# Patient Record
Sex: Female | Born: 1960 | Race: White | Hispanic: No | Marital: Married
Health system: Southern US, Community
[De-identification: ages and names within clinical notes are randomized; demographics above are authoritative.]

## PROBLEM LIST (undated history)

## (undated) HISTORY — PX: BREAST BIOPSY: SHX20

---

## 2005-01-05 ENCOUNTER — Ambulatory Visit: Payer: Self-pay | Admitting: Internal Medicine

## 2006-02-21 ENCOUNTER — Ambulatory Visit: Payer: Self-pay | Admitting: Internal Medicine

## 2007-04-26 ENCOUNTER — Ambulatory Visit: Payer: Self-pay | Admitting: Internal Medicine

## 2008-05-27 ENCOUNTER — Ambulatory Visit: Payer: Self-pay | Admitting: Internal Medicine

## 2015-10-26 ENCOUNTER — Other Ambulatory Visit: Payer: Self-pay | Admitting: Internal Medicine

## 2015-10-26 DIAGNOSIS — Z1231 Encounter for screening mammogram for malignant neoplasm of breast: Secondary | ICD-10-CM

## 2015-11-06 ENCOUNTER — Ambulatory Visit
Admission: RE | Admit: 2015-11-06 | Discharge: 2015-11-06 | Disposition: A | Discharge status: Home / Self Care | Payer: 59 | Source: Ambulatory Visit | Attending: Internal Medicine | Admitting: Internal Medicine

## 2015-11-06 DIAGNOSIS — Z1231 Encounter for screening mammogram for malignant neoplasm of breast: Secondary | ICD-10-CM | POA: Diagnosis present

## 2017-01-09 DIAGNOSIS — R197 Diarrhea, unspecified: Secondary | ICD-10-CM | POA: Diagnosis not present

## 2017-01-13 DIAGNOSIS — R197 Diarrhea, unspecified: Secondary | ICD-10-CM | POA: Diagnosis not present

## 2017-07-04 DIAGNOSIS — Z0000 Encounter for general adult medical examination without abnormal findings: Secondary | ICD-10-CM | POA: Diagnosis not present

## 2017-07-04 DIAGNOSIS — Z1231 Encounter for screening mammogram for malignant neoplasm of breast: Secondary | ICD-10-CM | POA: Diagnosis not present

## 2017-07-04 DIAGNOSIS — Z23 Encounter for immunization: Secondary | ICD-10-CM | POA: Diagnosis not present

## 2017-09-27 DIAGNOSIS — L400 Psoriasis vulgaris: Secondary | ICD-10-CM | POA: Diagnosis not present

## 2018-04-27 ENCOUNTER — Other Ambulatory Visit: Payer: Self-pay | Admitting: Internal Medicine

## 2018-04-27 DIAGNOSIS — Z1231 Encounter for screening mammogram for malignant neoplasm of breast: Secondary | ICD-10-CM

## 2018-05-17 ENCOUNTER — Ambulatory Visit
Admission: RE | Admit: 2018-05-17 | Discharge: 2018-05-17 | Disposition: A | Discharge status: Home / Self Care | Payer: 59 | Source: Ambulatory Visit | Attending: Internal Medicine | Admitting: Internal Medicine

## 2018-05-17 DIAGNOSIS — Z1231 Encounter for screening mammogram for malignant neoplasm of breast: Secondary | ICD-10-CM | POA: Diagnosis not present

## 2018-06-30 DIAGNOSIS — Z23 Encounter for immunization: Secondary | ICD-10-CM | POA: Diagnosis not present

## 2018-07-05 DIAGNOSIS — Z0000 Encounter for general adult medical examination without abnormal findings: Secondary | ICD-10-CM | POA: Diagnosis not present

## 2018-07-05 DIAGNOSIS — Z6841 Body Mass Index (BMI) 40.0 and over, adult: Secondary | ICD-10-CM | POA: Diagnosis not present

## 2018-09-28 DIAGNOSIS — Z1283 Encounter for screening for malignant neoplasm of skin: Secondary | ICD-10-CM | POA: Diagnosis not present

## 2018-09-28 DIAGNOSIS — L304 Erythema intertrigo: Secondary | ICD-10-CM | POA: Diagnosis not present

## 2018-09-28 DIAGNOSIS — L400 Psoriasis vulgaris: Secondary | ICD-10-CM | POA: Diagnosis not present

## 2018-10-02 IMAGING — MG MM DIGITAL SCREENING BILAT W/ TOMO W/ CAD
6 of 10 series · 6 of 30 positions shown · non-contrast
Comparison: Previous exam(s).

CLINICAL DATA: Screening.

EXAM:
DIGITAL SCREENING BILATERAL MAMMOGRAM WITH TOMO AND CAD

[L CC synth-2D]
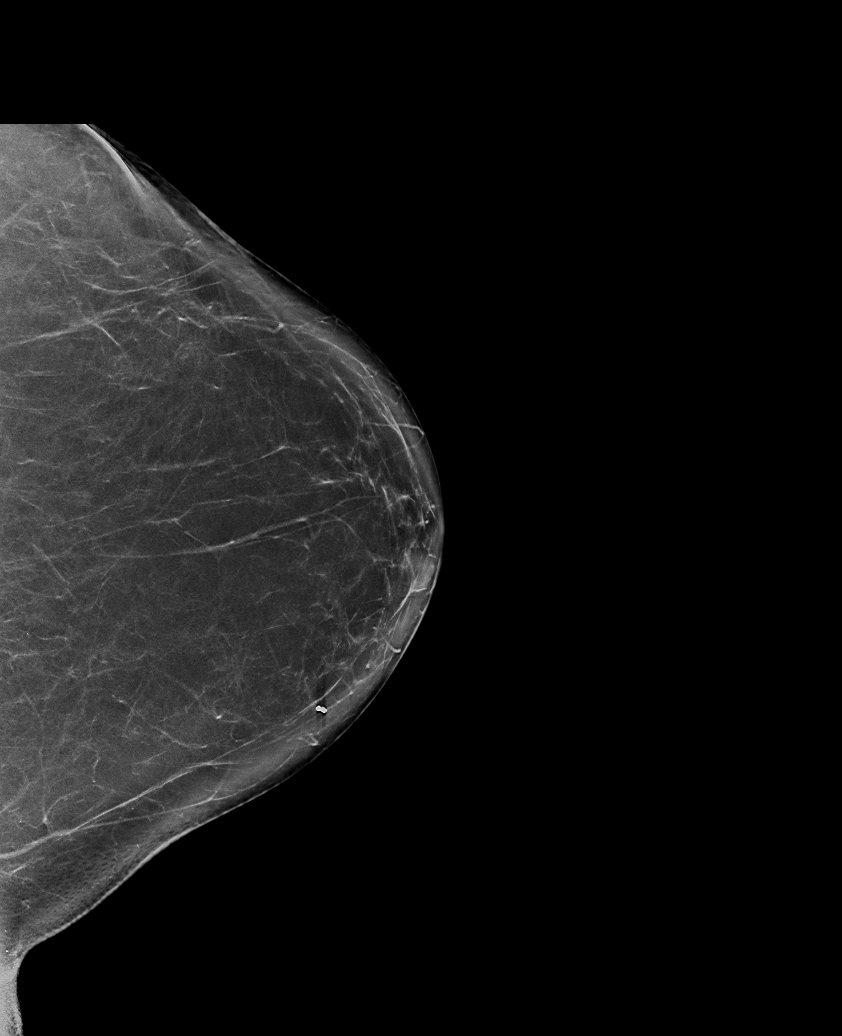

[L MLO synth-2D (1 of 2)]
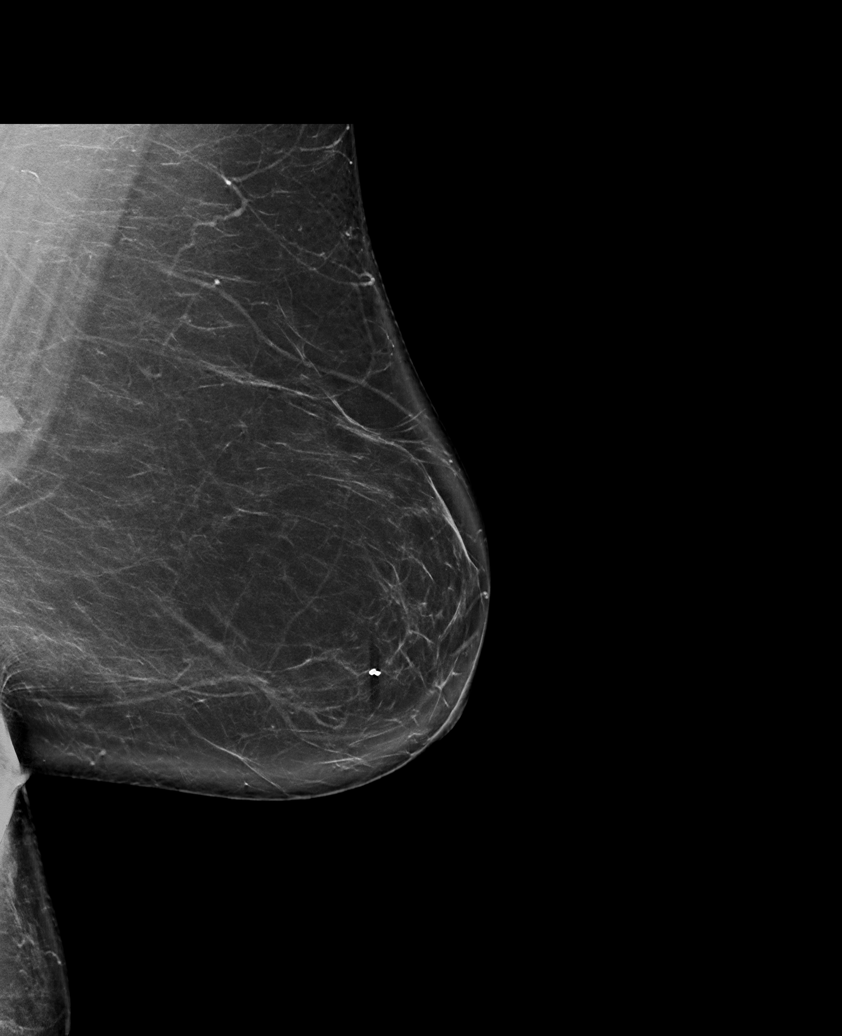

[R CC synth-2D]
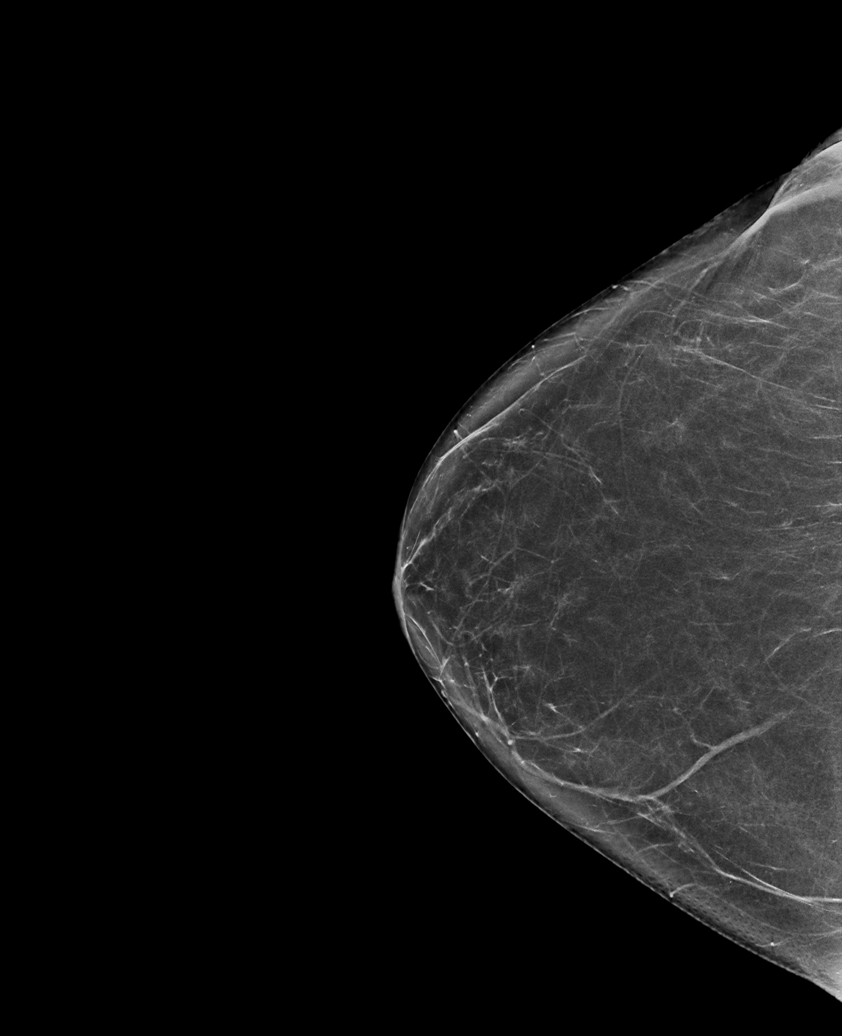

[R MLO synth-2D]
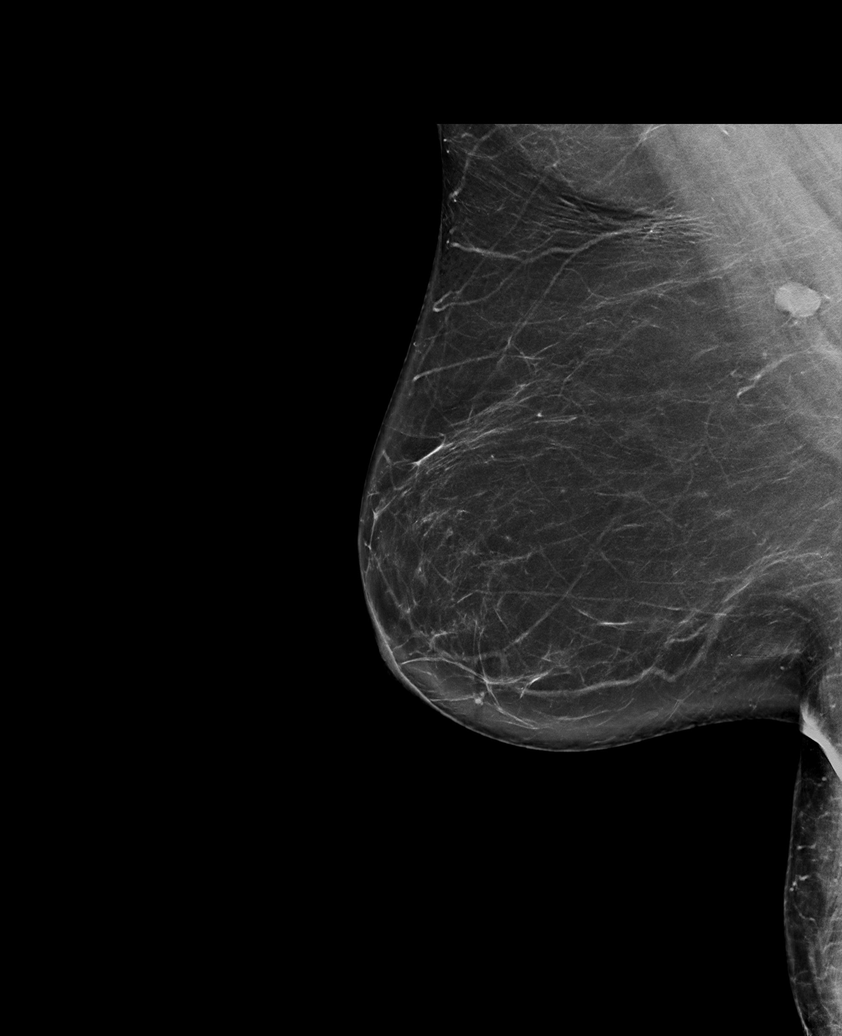

[L MLO synth-2D (2 of 2)]
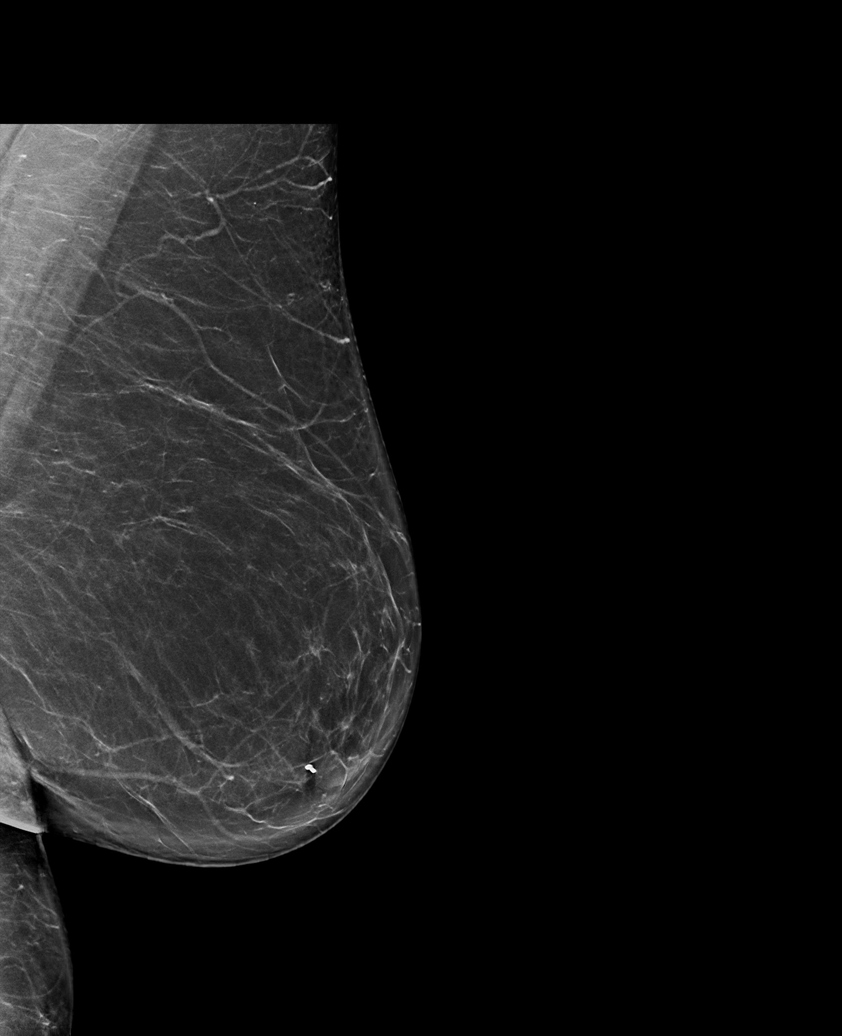

[L CC tomo · tomo slice 41/82.0]
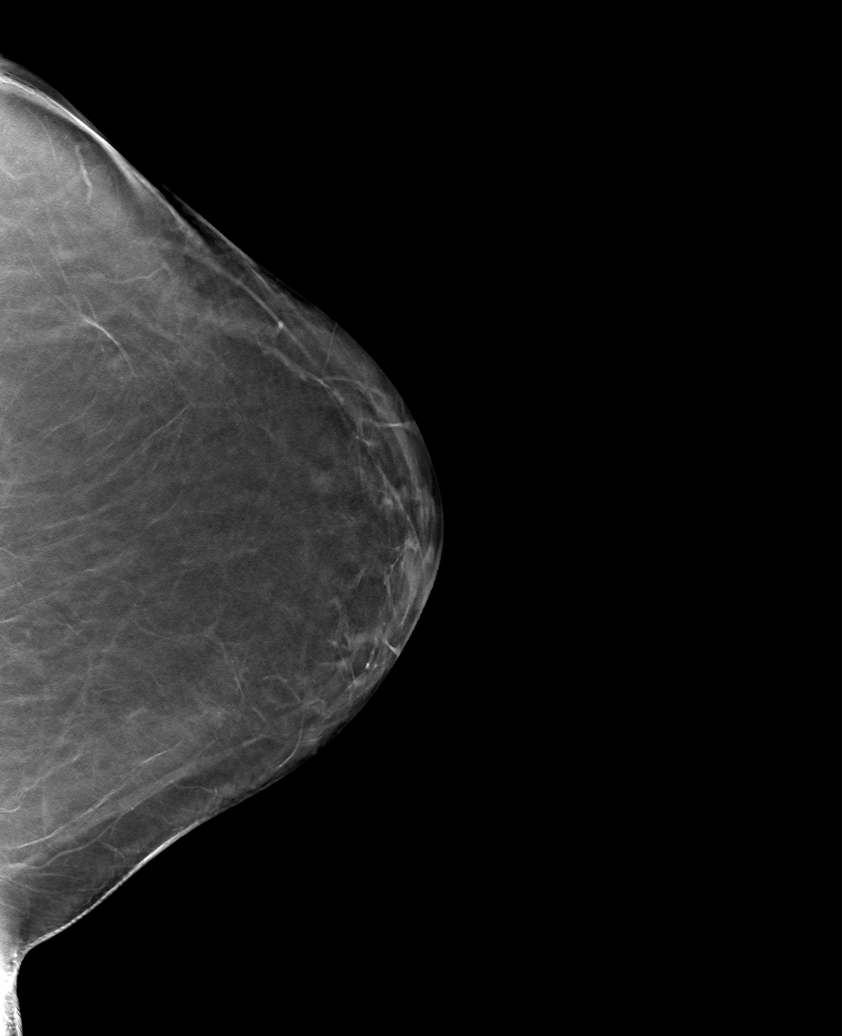

[6 of 30 positions shown; findings below may reference images not displayed]

ACR Breast Density Category b: There are scattered areas of
fibroglandular density.
FINDINGS: There are no findings suspicious for malignancy. Images were
processed with CAD.
IMPRESSION: No mammographic evidence of malignancy. A result letter of this
screening mammogram will be mailed directly to the patient.

RECOMMENDATION:
Screening mammogram in one year. (Code:[id number])

BI-RADS CATEGORY  1: Negative.

## 2019-07-10 ENCOUNTER — Other Ambulatory Visit: Payer: Self-pay | Admitting: Internal Medicine

## 2019-07-10 DIAGNOSIS — Z1231 Encounter for screening mammogram for malignant neoplasm of breast: Secondary | ICD-10-CM

## 2019-07-22 ENCOUNTER — Ambulatory Visit
Admission: RE | Admit: 2019-07-22 | Discharge: 2019-07-22 | Disposition: A | Discharge status: Home / Self Care | Payer: 59 | Source: Ambulatory Visit | Attending: Internal Medicine | Admitting: Internal Medicine

## 2019-07-22 ENCOUNTER — Other Ambulatory Visit: Payer: Self-pay

## 2019-07-22 ENCOUNTER — Encounter (INDEPENDENT_AMBULATORY_CARE_PROVIDER_SITE_OTHER): Payer: Self-pay

## 2019-07-22 DIAGNOSIS — Z1231 Encounter for screening mammogram for malignant neoplasm of breast: Secondary | ICD-10-CM | POA: Insufficient documentation

## 2020-07-06 ENCOUNTER — Other Ambulatory Visit: Payer: Self-pay | Admitting: Internal Medicine

## 2020-07-06 DIAGNOSIS — Z1231 Encounter for screening mammogram for malignant neoplasm of breast: Secondary | ICD-10-CM

## 2020-07-28 ENCOUNTER — Other Ambulatory Visit: Payer: Self-pay

## 2020-07-28 ENCOUNTER — Ambulatory Visit
Admission: RE | Admit: 2020-07-28 | Discharge: 2020-07-28 | Disposition: A | Discharge status: Home / Self Care | Payer: 59 | Source: Ambulatory Visit | Attending: Internal Medicine | Admitting: Internal Medicine

## 2020-07-28 DIAGNOSIS — Z1231 Encounter for screening mammogram for malignant neoplasm of breast: Secondary | ICD-10-CM | POA: Diagnosis not present

## 2020-08-03 ENCOUNTER — Other Ambulatory Visit: Payer: Self-pay | Admitting: Internal Medicine

## 2020-08-03 DIAGNOSIS — N6489 Other specified disorders of breast: Secondary | ICD-10-CM

## 2020-08-03 DIAGNOSIS — R928 Other abnormal and inconclusive findings on diagnostic imaging of breast: Secondary | ICD-10-CM

## 2020-08-12 ENCOUNTER — Other Ambulatory Visit: Payer: Self-pay

## 2020-08-12 ENCOUNTER — Ambulatory Visit
Admission: RE | Admit: 2020-08-12 | Discharge: 2020-08-12 | Disposition: A | Discharge status: Home / Self Care | Payer: 59 | Source: Ambulatory Visit | Attending: Internal Medicine | Admitting: Internal Medicine

## 2020-08-12 DIAGNOSIS — R928 Other abnormal and inconclusive findings on diagnostic imaging of breast: Secondary | ICD-10-CM

## 2020-08-12 DIAGNOSIS — N6489 Other specified disorders of breast: Secondary | ICD-10-CM

## 2021-07-12 ENCOUNTER — Other Ambulatory Visit: Payer: Self-pay | Admitting: Internal Medicine

## 2021-07-12 DIAGNOSIS — Z1231 Encounter for screening mammogram for malignant neoplasm of breast: Secondary | ICD-10-CM

## 2021-07-29 ENCOUNTER — Ambulatory Visit
Admission: RE | Admit: 2021-07-29 | Discharge: 2021-07-29 | Disposition: A | Discharge status: Home / Self Care | Payer: 59 | Source: Ambulatory Visit | Attending: Internal Medicine | Admitting: Internal Medicine

## 2021-07-29 ENCOUNTER — Other Ambulatory Visit: Payer: Self-pay

## 2021-07-29 DIAGNOSIS — Z1231 Encounter for screening mammogram for malignant neoplasm of breast: Secondary | ICD-10-CM | POA: Diagnosis not present

## 2021-11-10 ENCOUNTER — Other Ambulatory Visit: Payer: Self-pay

## 2021-11-10 ENCOUNTER — Emergency Department: Payer: 59

## 2021-11-10 ENCOUNTER — Emergency Department
Admission: EM | Admit: 2021-11-10 | Discharge: 2021-11-10 | Disposition: A | Discharge status: Home / Self Care | Payer: 59 | Attending: Emergency Medicine | Admitting: Emergency Medicine

## 2021-11-10 ENCOUNTER — Encounter: Payer: Self-pay | Admitting: Emergency Medicine

## 2021-11-10 DIAGNOSIS — I10 Essential (primary) hypertension: Secondary | ICD-10-CM | POA: Diagnosis present

## 2021-11-10 LAB — BASIC METABOLIC PANEL
Anion gap: 9 (ref 5–15)
BUN: 18 mg/dL (ref 8–23)
CO2: 25 mmol/L (ref 22–32)
Calcium: 9 mg/dL (ref 8.9–10.3)
Chloride: 104 mmol/L (ref 98–111)
Creatinine, Ser: 0.88 mg/dL (ref 0.44–1.00)
GFR, Estimated: >60 mL/min (ref >60)
Glucose, Bld: 95 mg/dL (ref 70–99)
Potassium: 3.9 mmol/L (ref 3.5–5.1)
Sodium: 138 mmol/L (ref 135–145)

## 2021-11-10 LAB — CBC
HCT: 38.6 % (ref 36.0–46.0)
Hemoglobin: 11.9 g/dL — ABNORMAL LOW (ref 12.0–15.0)
MCH: 27.8 pg (ref 26.0–34.0)
MCHC: 30.8 g/dL (ref 30.0–36.0)
MCV: 90.2 fL (ref 80.0–100.0)
Platelets: 367 10*3/uL (ref 150–400)
RBC: 4.28 MIL/uL (ref 3.87–5.11)
RDW: 13.5 % (ref 11.5–15.5)
WBC: 8.1 10*3/uL (ref 4.0–10.5)
nRBC: 0 % (ref 0.0–0.2)

## 2021-11-10 LAB — TROPONIN I (HIGH SENSITIVITY)
Troponin I (High Sensitivity): 4 ng/L (ref <18)
Troponin I (High Sensitivity): 5 ng/L (ref <18)

## 2021-11-10 MED ORDER — AMLODIPINE BESYLATE 5 MG PO TABS: 5.0000 mg | ORAL_TABLET | Freq: Every day | ORAL | 0 refills | Status: AC

## 2021-11-10 NOTE — ED Notes (Signed)
4 yof c/o having an uncomfortable feeling in her chest and some nausea today. Recent hx of covid two weeks ago. Pt rated pain 3.

## 2021-11-10 NOTE — Discharge Instructions (Signed)
-  Follow-up with your primary care provider as discussed -Take your medication as prescribed -Return to the emergency department at any time if you begin to experience any new or worsening symptoms.

## 2021-11-10 NOTE — ED Triage Notes (Signed)
First Nurse Note:  Arrives from [facility] for evaluation of elevated BP.  AAOx3. Skin warm and dry .NAD

## 2021-11-10 NOTE — ED Provider Notes (Signed)
[facility] Provider Note    None    (approximate)   History   Chief Complaint Hypertension   HPI Jasmine Wiggins is a 61 y.o. female, no remarkable medical history, presents to the emergency department for evaluation of hypertension.  Patient states that she presented to [facility] clinic earlier this morning for evaluation of funny feeling in her chest.  When she presented, found that her blood pressure was [id number] systolic.  They sent her over here for further evaluation.  Patient states that she currently feels well.  Denies fever/chills, shortness of breath, abdominal pain, back pain, headache, nausea/vomiting, or urinary symptoms  History Limitations: No limitations      Physical Exam  Triage Vital Signs: ED Triage Vitals [11/10/21 1520]  Enc Vitals Group     BP (!) 160/99     Pulse Rate 65     Resp 17     Temp 98.3 F (36.8 C)     Temp Source Oral     SpO2 100 %     Weight 284 lb (128.8 kg)     Height 5\' 4"  (1.626 m)     Head Circumference      Peak Flow      Pain Score 2     Pain Loc      Pain Edu?      Excl. in [place]?     Most recent vital signs: Vitals:   11/10/21 1520  BP: (!) 160/99  Pulse: 65  Resp: 17  Temp: 98.3 F (36.8 C)  SpO2: 100%    General: Awake, NAD.  CV: Good peripheral perfusion.  S1-S2 present no murmurs rubs or gallops. Resp: Normal effort.  Lung sounds clear bilaterally in the apices and bases Abd: Soft, non-tender. No distention.  Neuro: At baseline. No gross neurological deficits. Other: No chest wall tenderness  Physical Exam    ED Results / Procedures / Treatments  Labs (all labs ordered are listed, but only abnormal results are displayed) Labs Reviewed  CBC - Abnormal; Notable for the following components:      Result Value   Hemoglobin 11.9 (*)    All other components within normal limits  BASIC METABOLIC PANEL  TROPONIN I (HIGH SENSITIVITY)  TROPONIN I (HIGH SENSITIVITY)      EKG Sinus bradycardia, rate 57, no ST segment changes, no axis deviations, no AV blocks.   RADIOLOGY  ED Provider Interpretation: I personally reviewed and interpreted this image.  No evidence of active cardiopulmonary disease  DG Chest 2 View  Result Date: 11/10/2021 CLINICAL DATA:  Chest pain EXAM: CHEST - 2 VIEW COMPARISON:  None. FINDINGS: Cardiac silhouette and mediastinal contours are within normal limits. Mild bilateral lower lung interstitial thickening on frontal view appears chronic, possibly scarring. No focal airspace opacity. No pulmonary edema, pleural effusion, or pneumothorax. Moderate multilevel degenerative disc changes of the thoracic spine. IMPRESSION: Mild bilateral lower lung linear opacities, favored to represent chronic scarring. No definite acute lung process. Electronically Signed   By: Yvonne Kendall M.D.   On: 11/10/2021 15:45    PROCEDURES:  Critical Care performed: None.  Procedures    MEDICATIONS ORDERED IN ED: Medications - No data to display   IMPRESSION / MDM / [place] / ED COURSE  I reviewed the triage vital signs and the nursing notes.  Jasmine Wiggins is a 61 y.o. female, no remarkable medical history, presents to the emergency department for evaluation of hypertension.  Patient states that she presented to [facility] clinic earlier this morning for evaluation of funny feeling in her chest.  When she presented, found that her blood pressure was [id number] systolic.  They sent her over here for further evaluation.  Patient states that she currently feels well.  Differential diagnosis includes, but is not limited to, ACS, PE, dissection, hypertensive crisis, GERD  ED Course Patient appears well.  She is notably hypertensive at 160/99.  She is afebrile  CBC unremarkable for leukocytosis or clinically significant anemia.  BMP unremarkable for electrolyte abnormalities.  Initial troponin 4.  Second troponin  5.  Unremarkable EKG.  Unlikely ACS or myocarditis/pericarditis  Chest x-ray negative for evidence of cardiomegaly or pneumonia.  Assessment/Plan Given the patient's history, physical exam, and work-up thus far, I do not suspect a serious or life-threatening pathology.  EKG, labs, and imaging reassuring.  Very low suspicion for dissection or pulmonary embolism given her presentation.  Patient is notably hypertensive, but I do not believe it is in need of any emergent treatment at this time.  Patient states that she has a established primary care provider that she can speak with [place] about, though is unsure how long it will take to get an appointment.  We will go ahead and provide patient a short-term prescription for amlodipine for hypertension management.  We will plan to discharge this patient.   Patient was provided with anticipatory guidance, return precautions, and educational material. Encouraged the patient to return to the emergency department at any time if they begin to experience any new or worsening symptoms.      FINAL CLINICAL IMPRESSION(S) / ED DIAGNOSES   Final diagnoses:  Hypertension, unspecified type     Rx / DC Orders   ED Discharge Orders          Ordered    amLODipine (NORVASC) 5 MG tablet  Daily        11/10/21 2017             Note:  This document was prepared using Dragon voice recognition software and may include unintentional dictation errors.   Teodoro Spray, [place] 11/10/21 2020    Rada Hay, MD 11/10/21 2036

## 2021-11-10 NOTE — ED Triage Notes (Signed)
Pt comes into the ED via POV c/o HTN today.  Pt has no h/o HTN, but her BP had systolic over 190.  Pt does admit that her chest has "felt funny", but denies that it is an actual pain or pressure.  Pt currently ambulatory to triage and in NAD with even and unlabored respirations.

## 2022-07-13 ENCOUNTER — Other Ambulatory Visit: Payer: Self-pay | Admitting: Internal Medicine

## 2022-07-13 DIAGNOSIS — Z1231 Encounter for screening mammogram for malignant neoplasm of breast: Secondary | ICD-10-CM

## 2022-08-12 ENCOUNTER — Ambulatory Visit
Admission: RE | Admit: 2022-08-12 | Discharge: 2022-08-12 | Disposition: A | Discharge status: Home / Self Care | Payer: 59 | Source: Ambulatory Visit | Attending: Internal Medicine | Admitting: Internal Medicine

## 2022-08-12 DIAGNOSIS — Z1231 Encounter for screening mammogram for malignant neoplasm of breast: Secondary | ICD-10-CM | POA: Insufficient documentation

## 2023-07-12 ENCOUNTER — Other Ambulatory Visit: Payer: Self-pay | Admitting: Internal Medicine

## 2023-07-12 DIAGNOSIS — Z1231 Encounter for screening mammogram for malignant neoplasm of breast: Secondary | ICD-10-CM

## 2023-07-21 ENCOUNTER — Other Ambulatory Visit: Payer: Self-pay | Admitting: Internal Medicine

## 2023-07-21 DIAGNOSIS — Z0000 Encounter for general adult medical examination without abnormal findings: Secondary | ICD-10-CM

## 2023-07-26 ENCOUNTER — Ambulatory Visit
Admission: RE | Admit: 2023-07-26 | Discharge: 2023-07-26 | Disposition: A | Discharge status: Home / Self Care | Payer: 59 | Source: Ambulatory Visit | Attending: Internal Medicine | Admitting: Internal Medicine

## 2023-07-26 DIAGNOSIS — Z0000 Encounter for general adult medical examination without abnormal findings: Secondary | ICD-10-CM | POA: Insufficient documentation

## 2023-08-14 ENCOUNTER — Ambulatory Visit
Admission: RE | Admit: 2023-08-14 | Discharge: 2023-08-14 | Disposition: A | Discharge status: Home / Self Care | Payer: 59 | Source: Ambulatory Visit | Attending: Internal Medicine | Admitting: Internal Medicine

## 2023-08-14 DIAGNOSIS — Z1231 Encounter for screening mammogram for malignant neoplasm of breast: Secondary | ICD-10-CM | POA: Diagnosis present

## 2024-07-16 ENCOUNTER — Other Ambulatory Visit: Payer: Self-pay | Admitting: Internal Medicine

## 2024-07-16 DIAGNOSIS — Z1231 Encounter for screening mammogram for malignant neoplasm of breast: Secondary | ICD-10-CM

## 2024-08-03 LAB — COLOGUARD: COLOGUARD: NEGATIVE

## 2024-08-14 ENCOUNTER — Ambulatory Visit
Admission: RE | Admit: 2024-08-14 | Discharge: 2024-08-14 | Disposition: A | Discharge status: Home / Self Care | Source: Ambulatory Visit | Attending: Internal Medicine | Admitting: Internal Medicine

## 2024-08-14 DIAGNOSIS — Z1231 Encounter for screening mammogram for malignant neoplasm of breast: Secondary | ICD-10-CM | POA: Insufficient documentation

## 2024-08-21 ENCOUNTER — Other Ambulatory Visit: Payer: Self-pay | Admitting: Internal Medicine

## 2024-08-21 DIAGNOSIS — R928 Other abnormal and inconclusive findings on diagnostic imaging of breast: Secondary | ICD-10-CM

## 2024-08-22 ENCOUNTER — Ambulatory Visit
Admission: RE | Admit: 2024-08-22 | Discharge: 2024-08-22 | Disposition: A | Discharge status: Home / Self Care | Source: Ambulatory Visit | Attending: Internal Medicine | Admitting: Internal Medicine

## 2024-08-22 DIAGNOSIS — R928 Other abnormal and inconclusive findings on diagnostic imaging of breast: Secondary | ICD-10-CM | POA: Diagnosis present
